# Patient Record
Sex: Male | Born: 1985
Health system: Southern US, Community
[De-identification: ages and names within clinical notes are randomized; demographics above are authoritative.]

---

## 2017-04-06 DIAGNOSIS — M79672 Pain in left foot: Secondary | ICD-10-CM | POA: Diagnosis not present

## 2017-04-14 DIAGNOSIS — M79672 Pain in left foot: Secondary | ICD-10-CM | POA: Diagnosis not present

## 2019-07-24 DIAGNOSIS — Z1331 Encounter for screening for depression: Secondary | ICD-10-CM | POA: Diagnosis not present

## 2019-07-24 DIAGNOSIS — L600 Ingrowing nail: Secondary | ICD-10-CM | POA: Diagnosis not present

## 2019-07-24 DIAGNOSIS — R1030 Lower abdominal pain, unspecified: Secondary | ICD-10-CM | POA: Diagnosis not present

## 2019-07-24 DIAGNOSIS — Z6822 Body mass index (BMI) 22.0-22.9, adult: Secondary | ICD-10-CM | POA: Diagnosis not present

## 2020-03-29 ENCOUNTER — Other Ambulatory Visit: Payer: Self-pay

## 2020-03-29 ENCOUNTER — Encounter (HOSPITAL_COMMUNITY): Payer: Self-pay

## 2020-03-29 ENCOUNTER — Emergency Department (HOSPITAL_COMMUNITY): Payer: Self-pay

## 2020-03-29 ENCOUNTER — Emergency Department (HOSPITAL_COMMUNITY)
Admission: EM | Admit: 2020-03-29 | Discharge: 2020-03-29 | Disposition: A | Discharge status: Home / Self Care | Payer: Self-pay | Attending: Emergency Medicine | Admitting: Emergency Medicine

## 2020-03-29 DIAGNOSIS — R1084 Generalized abdominal pain: Secondary | ICD-10-CM | POA: Diagnosis not present

## 2020-03-29 DIAGNOSIS — R109 Unspecified abdominal pain: Secondary | ICD-10-CM | POA: Diagnosis not present

## 2020-03-29 LAB — COMPREHENSIVE METABOLIC PANEL WITH GFR
ALT: 22 U/L (ref 0–44)
AST: 17 U/L (ref 15–41)
Albumin: 4.9 g/dL (ref 3.5–5.0)
Alkaline Phosphatase: 56 U/L (ref 38–126)
Anion gap: 11 (ref 5–15)
BUN: 15 mg/dL (ref 6–20)
CO2: 27 mmol/L (ref 22–32)
Calcium: 9.9 mg/dL (ref 8.9–10.3)
Chloride: 99 mmol/L (ref 98–111)
Creatinine, Ser: 1.18 mg/dL (ref 0.61–1.24)
GFR calc Af Amer: >60 mL/min
GFR calc non Af Amer: >60 mL/min
Glucose, Bld: 107 mg/dL — ABNORMAL HIGH (ref 70–99)
Potassium: 4 mmol/L (ref 3.5–5.1)
Sodium: 137 mmol/L (ref 135–145)
Total Bilirubin: 1.4 mg/dL — ABNORMAL HIGH (ref 0.3–1.2)
Total Protein: 7.9 g/dL (ref 6.5–8.1)

## 2020-03-29 LAB — URINALYSIS, ROUTINE W REFLEX MICROSCOPIC
Bacteria, UA: NONE SEEN
Bilirubin Urine: NEGATIVE
Glucose, UA: NEGATIVE mg/dL
Hgb urine dipstick: NEGATIVE
Ketones, ur: NEGATIVE mg/dL
Nitrite: NEGATIVE
Protein, ur: NEGATIVE mg/dL
Specific Gravity, Urine: 1.009 (ref 1.005–1.030)
pH: 5 (ref 5.0–8.0)

## 2020-03-29 LAB — CBC
HCT: 45 % (ref 39.0–52.0)
Hemoglobin: 15.8 g/dL (ref 13.0–17.0)
MCH: 31.7 pg (ref 26.0–34.0)
MCHC: 35.1 g/dL (ref 30.0–36.0)
MCV: 90.2 fL (ref 80.0–100.0)
Platelets: 166 10*3/uL (ref 150–400)
RBC: 4.99 MIL/uL (ref 4.22–5.81)
RDW: 12.1 % (ref 11.5–15.5)
WBC: 6.4 10*3/uL (ref 4.0–10.5)
nRBC: 0 % (ref 0.0–0.2)

## 2020-03-29 LAB — LIPASE, BLOOD: Lipase: 24 U/L (ref 11–51)

## 2020-03-29 MED ORDER — SODIUM CHLORIDE 0.9 % IV BOLUS
500.0000 mL | Freq: Once | INTRAVENOUS | Status: AC
  Administered 2020-03-29: 500 mL via INTRAVENOUS

## 2020-03-29 MED ORDER — SODIUM CHLORIDE (PF) 0.9 % IJ SOLN
INTRAMUSCULAR | Status: AC
  Filled 2020-03-29: qty 50

## 2020-03-29 MED ORDER — IOHEXOL 300 MG/ML  SOLN
100.0000 mL | Freq: Once | INTRAMUSCULAR | Status: AC | PRN
  Administered 2020-03-29: 100 mL via INTRAVENOUS

## 2020-03-29 MED ORDER — KETOROLAC TROMETHAMINE 15 MG/ML IJ SOLN
15.0000 mg | Freq: Once | INTRAMUSCULAR | Status: AC
  Administered 2020-03-29: 15 mg via INTRAVENOUS
  Filled 2020-03-29: qty 1

## 2020-03-29 NOTE — ED Provider Notes (Signed)
[facility]-EMERGENCY DEPT Provider Note   CSN: [id number] Arrival date & time: 03/29/20  1107     History Chief Complaint  Patient presents with   Abdominal Pain    Caleb Adams is a 34 y.o. male.  34 year old male with prior medical history detailed below presents for evaluation of abdominal discomfort.  Patient describes intermittent sharp abdominal pain.  Pain is an ongoing issue over the last 4 to 6 months.  He has not yet seen any medical provider about this discomfort.  He reports worsening symptoms over the last week.  He denies associated fever.  He denies associated nausea, vomiting, bowel movement change.  He denies current pain or discomfort.  Pain does not seem to be associated with any specific activity or movement.  The history is provided by the patient and medical records.  Abdominal Pain Pain location:  Periumbilical Pain quality: sharp, shooting and stabbing   Pain radiates to:  Does not radiate Pain severity:  Moderate Onset quality:  Sudden Timing:  Intermittent Progression:  Waxing and waning Chronicity:  New Relieved by:  Nothing Worsened by:  Nothing      History reviewed. No pertinent past medical history.  There are no problems to display for this patient.   History reviewed. No pertinent surgical history.     History reviewed. No pertinent family history.  Social History   Tobacco Use   Smoking status: Never Smoker   Smokeless tobacco: Never Used  Substance Use Topics   Alcohol use: Yes    Comment: occasional   Drug use: Never    Home Medications Prior to Admission medications   Not on File    Allergies    Patient has no known allergies.  Review of Systems   Review of Systems  Gastrointestinal: Positive for abdominal pain.  All other systems reviewed and are negative.   Physical Exam Updated Vital Signs BP (!) 147/106 (BP Location: Right Arm)    Pulse 91    Temp 98.4 F (36.9 C) (Oral)     Resp 17    SpO2 100%   Physical Exam Vitals and nursing note reviewed.  Constitutional:      General: He is not in acute distress.    Appearance: He is well-developed.  HENT:     Head: Normocephalic and atraumatic.  Eyes:     Conjunctiva/sclera: Conjunctivae normal.     Pupils: Pupils are equal, round, and reactive to light.  Cardiovascular:     Rate and Rhythm: Normal rate and regular rhythm.     Heart sounds: Normal heart sounds.  Pulmonary:     Effort: Pulmonary effort is normal. No respiratory distress.     Breath sounds: Normal breath sounds.  Abdominal:     General: Bowel sounds are normal. There is no distension.     Palpations: Abdomen is soft.     Tenderness: There is no abdominal tenderness.  Musculoskeletal:        General: No deformity. Normal range of motion.     Cervical back: Normal range of motion and neck supple.  Skin:    General: Skin is warm and dry.  Neurological:     Mental Status: He is alert and oriented to person, place, and time.     ED Results / Procedures / Treatments   Labs (all labs ordered are listed, but only abnormal results are displayed) Labs Reviewed  COMPREHENSIVE METABOLIC PANEL - Abnormal; Notable for the following components:  Result Value   Glucose, Bld 107 (*)    Total Bilirubin 1.4 (*)    All other components within normal limits  URINALYSIS, ROUTINE W REFLEX MICROSCOPIC - Abnormal; Notable for the following components:   Color, Urine STRAW (*)    Leukocytes,Ua TRACE (*)    All other components within normal limits  LIPASE, BLOOD  CBC    EKG None  Radiology CT Abdomen Pelvis W Contrast  Result Date: 03/29/2020 CLINICAL DATA:  Acute nonlocalized abdominal pain. Patient reports low pelvic pain for 4 months, worse over the past week. EXAM: CT ABDOMEN AND PELVIS WITH CONTRAST TECHNIQUE: Multidetector CT imaging of the abdomen and pelvis was performed using the standard protocol following bolus administration of  intravenous contrast. CONTRAST:  OMNIPAQUE IOHEXOL 300 MG/ML  SOLN COMPARISON:  None. FINDINGS: Lower chest: Lung bases are clear. Hepatobiliary: No focal liver abnormality. Partially distended gallbladder with possible intraluminal stones. No pericholecystic inflammation. No biliary dilatation. Pancreas: No ductal dilatation or inflammation. Spleen: Normal in size without focal abnormality. Adrenals/Urinary Tract: No adrenal nodule. Mild left renal parenchymal atrophy with diffuse thinning and areas of scarring. Dilatation of the left renal pelvis and major calices without ureteral dilatation. 3 mm nonobstructing stone in the lower left kidney. No ureteral calculus. No perinephric edema. Normal appearance of the right kidney without hydronephrosis. Unremarkable urinary bladder. Stomach/Bowel: Nondistended stomach. Small bowel decompressed without obstruction or inflammation. Normal appendix. High-density material within the cecum and proximal ascending colon may represent ingested bismuth containing products. The terminal ileum is normal. Small volume of colonic stool. Mild sigmoid colonic redundancy. No colonic wall thickening. No significant diverticular disease. Vascular/Lymphatic: No significant vascular findings are present. Abdominal aorta is normal in caliber. The portal vein is patent. No enlarged abdominal or pelvic lymph nodes. Reproductive: Prostate is unremarkable. Other: Tiny fat containing umbilical hernia. No free air, free fluid, or intra-abdominal fluid collection. Musculoskeletal: There are no acute or suspicious osseous abnormalities. Incidental bone islands in the left proximal femur. IMPRESSION: 1. No acute abnormality. 2. Possible gallstones. No CT findings of cholecystitis or acute gallbladder inflammation. 3. Mild left renal atrophy with diffuse parenchymal thinning and areas of scarring. Mild left hydronephrosis with transition at the ureteropelvic junction. No perinephric edema.  Overall findings suggest chronic UPJ obstruction, possibly congenital. 4. Nonobstructing 3 mm stone in the lower left kidney. No ureteral or obstructing calculus. Electronically Signed   By: Narda Rutherford M.D.   On: 03/29/2020 18:56    Procedures Procedures (including critical care time)  Medications Ordered in ED Medications  sodium chloride 0.9 % bolus 500 mL (has no administration in time range)    ED Course  I have reviewed the triage vital signs and the nursing notes.  Pertinent labs & imaging results that were available during my care of the patient were reviewed by me and considered in my medical decision making (see chart for details).    MDM Rules/Calculators/A&P                          MDM  Screen complete  Caleb Adams was evaluated in Emergency Department on 03/29/2020 for the symptoms described in the history of present illness. He was evaluated in the context of the global COVID-19 pandemic, which necessitated consideration that the patient might be at risk for infection with the SARS-CoV-2 virus that causes COVID-19. Institutional protocols and algorithms that pertain to the evaluation of patients at risk for COVID-19 are  in a state of rapid change based on information released by regulatory bodies including the CDC and federal and state organizations. These policies and algorithms were followed during the patient's care in the ED.  Patient is presenting for evaluation of chronic abdominal discomfort.  His work-up today did not reveal significant acute medical pathology.  CT imaging could not exclude the possibility of gallstones.  He does not have evidence of biliary obstruction or cholecystitis on work-up.  CT imaging also revealed possible renal stone.  He does not have evidence of obstruction secondary to same.   Patient is advised to closely follow-up with his primary care provider and also with urology.  Given the patient's described symptoms it is possible  that he is having intermittent renal colic.  That being said, he is comfortable at time of discharge.  He does understand the need for close outpatient follow-up.  Strict return precautions given and understood.  Final Clinical Impression(s) / ED Diagnoses Final diagnoses:  Generalized abdominal pain    Rx / DC Orders ED Discharge Orders    None       Wynetta Fines, MD 03/29/20 2002

## 2020-03-29 NOTE — ED Triage Notes (Signed)
Pt c/o bilateral lower quadrant stabbing abd pain, intermittent for several months. Denies any n/v or diarrhea. Denies any other sx

## 2020-03-29 NOTE — ED Notes (Signed)
Assumed care of patient at this time, nad noted, sr up x2, bed locked and low, call bell w/I reach.  Will continue to monitor.  Spouse at the bedside.  

## 2020-03-29 NOTE — Discharge Instructions (Addendum)
Please return for any problem.  You will require further work-up of your reported abdominal discomfort.  CT imaging today did not show any emergent process.  CT imaging today did not exclude the possibility of gallstones or possible kidney stones.  Your regular care providers will need to evaluate you more fully for possible gallstones and kidney stones.

## 2022-01-13 IMAGING — CT CT ABD-PELV W/ CM
2 of 4 series · 16 of 46 positions shown, 18 images · IV contrast (omnipaque)
Comparison: None.

CLINICAL DATA: Acute nonlocalized abdominal pain. Patient reports
low pelvic pain for 4 months, worse over the past week.

EXAM:
CT ABDOMEN AND PELVIS WITH CONTRAST
TECHNIQUE: Multidetector CT imaging of the abdomen and pelvis was performed
using the standard protocol following bolus administration of
intravenous contrast.
CONTRAST:  100mL OMNIPAQUE IOHEXOL 300 MG/ML  SOLN

[Series 2: axial st · axial · 0.82mm/px · z∈[-431,-11]mm · 13 of 96 slices shown, 15 images]
[im 6/96  soft-tissue]
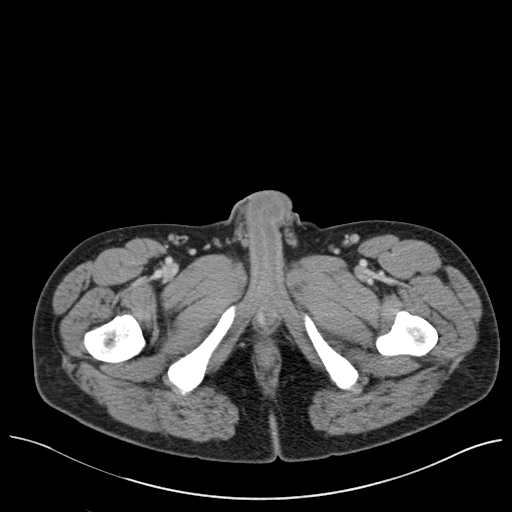
[im 6/96  bone]
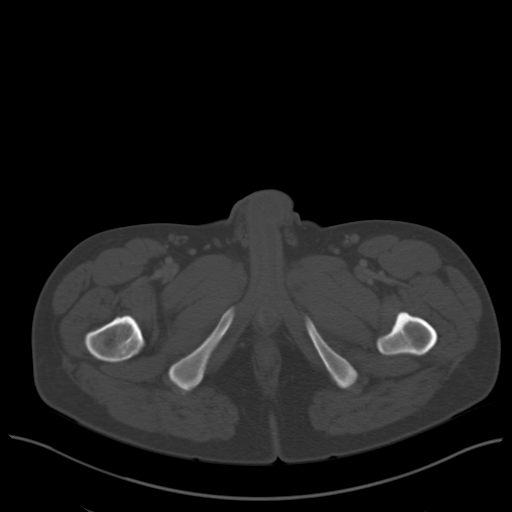
[im 11/96  soft-tissue]
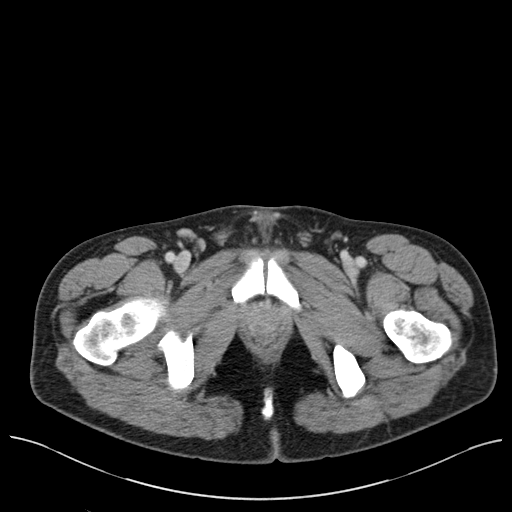
[im 22/96  soft-tissue]
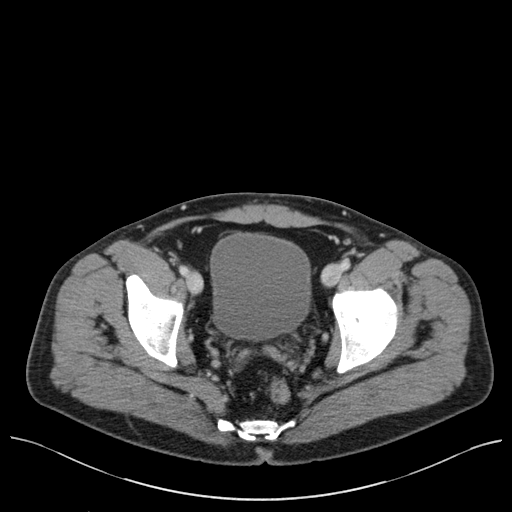
[im 27/96  soft-tissue]
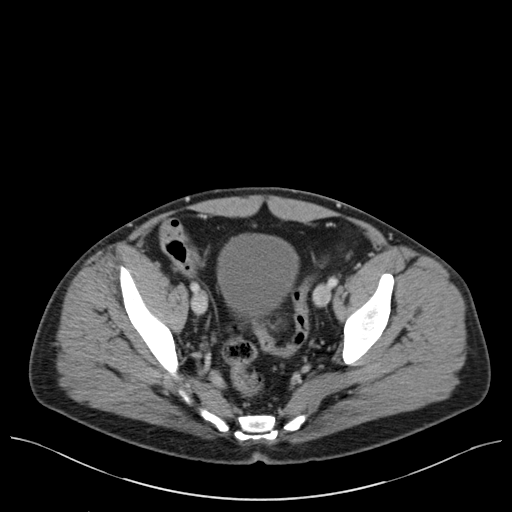
[im 32/96  soft-tissue]
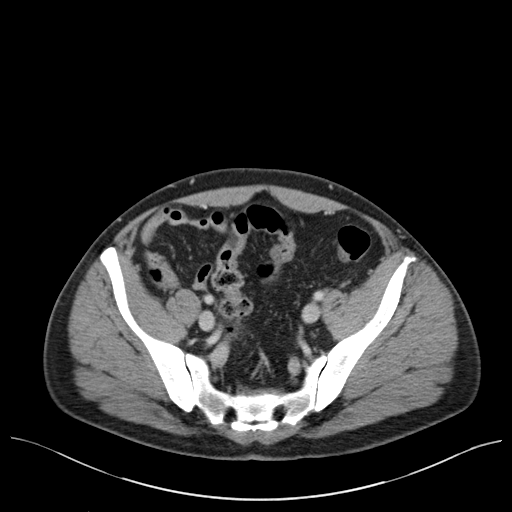
[im 43/96  soft-tissue]
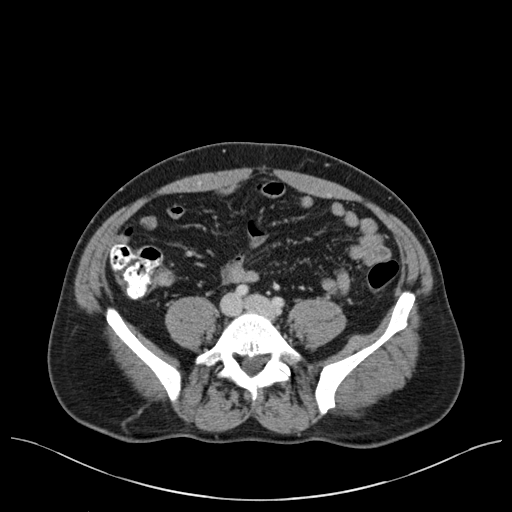
[im 48/96  soft-tissue]
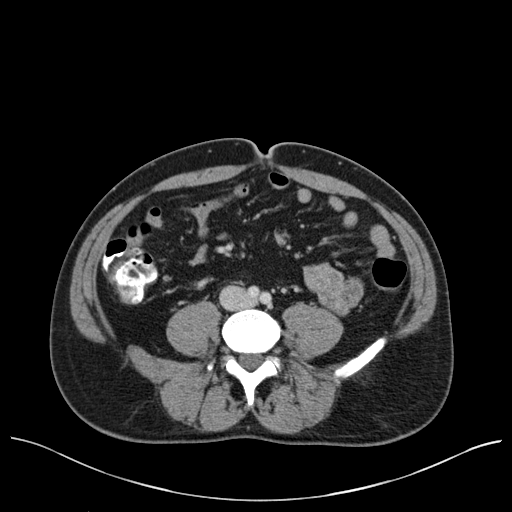
[im 53/96  soft-tissue]
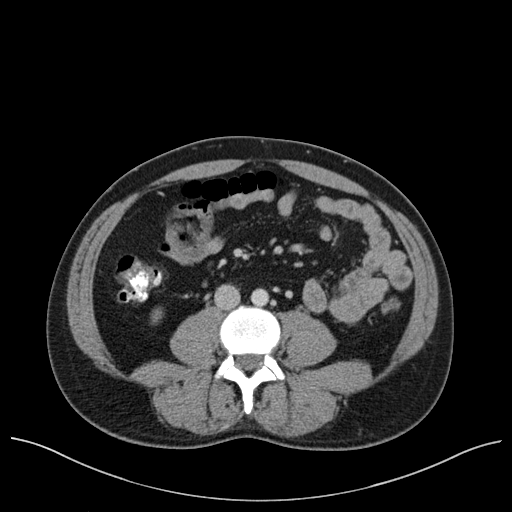
[im 64/96  soft-tissue]
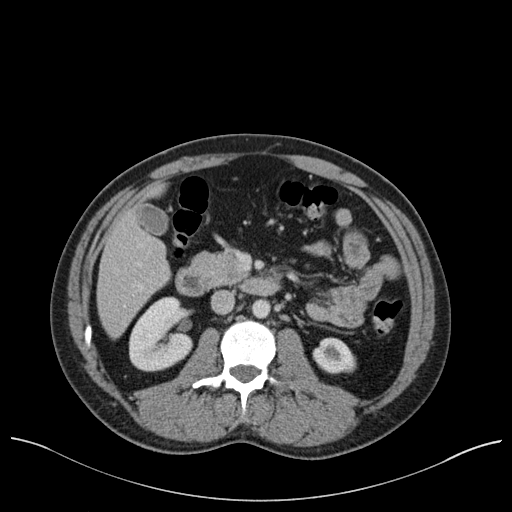
[im 64/96  bone]
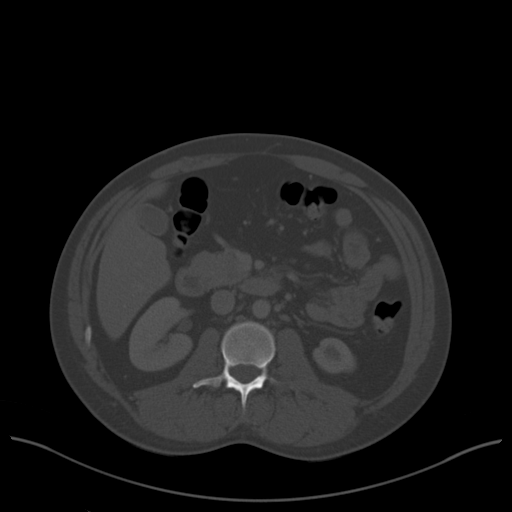
[im 69/96  soft-tissue]
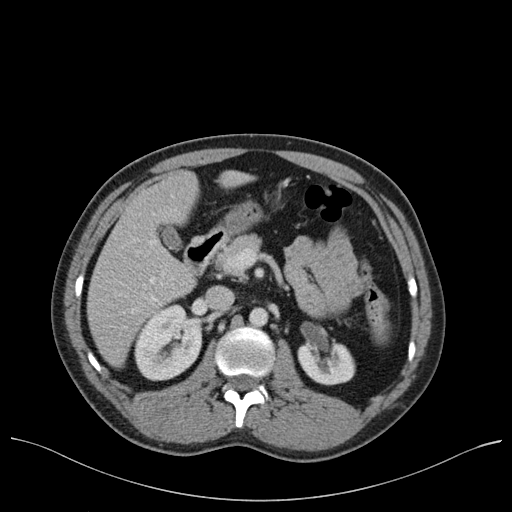
[im 74/96  soft-tissue]
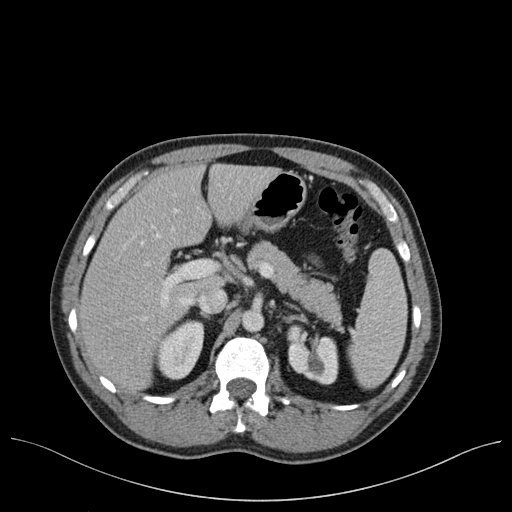
[im 85/96  soft-tissue]
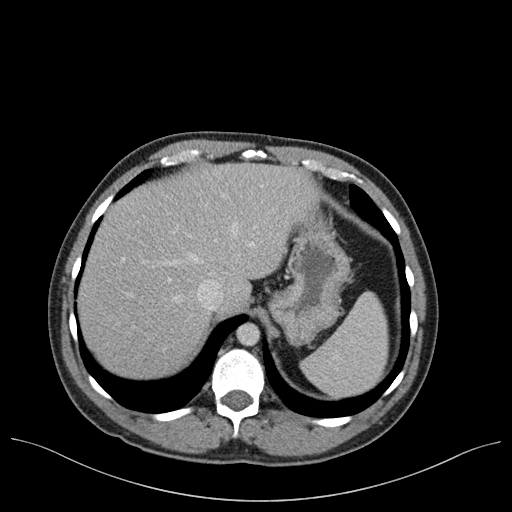
[im 90/96  soft-tissue]
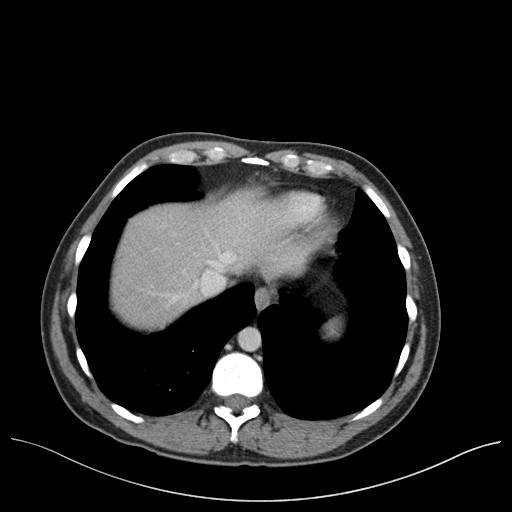

[Series 5: coronal st · coronal · 0.91mm/px · 3 of 151 slices shown]
[im 51/151  soft-tissue]
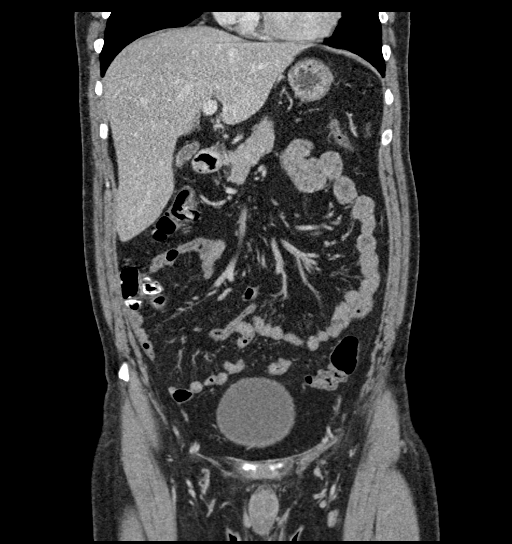
[im 67/151  soft-tissue]
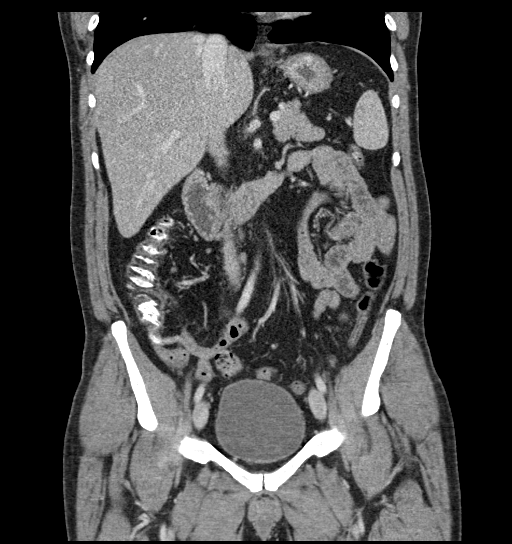
[im 84/151  soft-tissue]
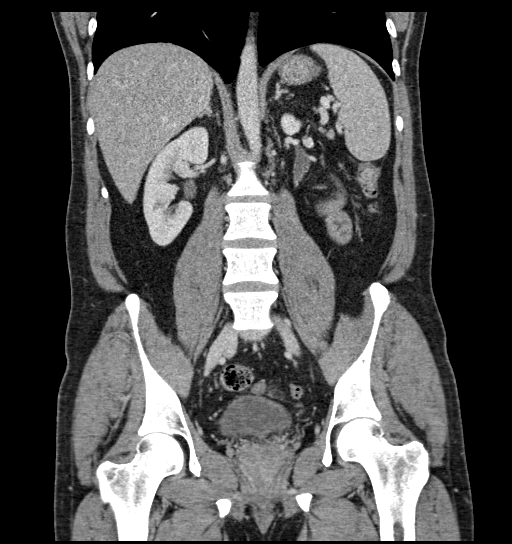

[16 of 46 positions shown; findings below may reference images not displayed]

FINDINGS: Lower chest: Lung bases are clear.

Hepatobiliary: No focal liver abnormality. Partially distended
gallbladder with possible intraluminal stones. No pericholecystic
inflammation. No biliary dilatation.

Pancreas: No ductal dilatation or inflammation.

Spleen: Normal in size without focal abnormality.

Adrenals/Urinary Tract: No adrenal nodule. Mild left renal
parenchymal atrophy with diffuse thinning and areas of scarring.
Dilatation of the left renal pelvis and major calices without
ureteral dilatation. 3 mm nonobstructing stone in the lower left
kidney. No ureteral calculus. No perinephric edema. Normal
appearance of the right kidney without hydronephrosis. Unremarkable
urinary bladder.

Stomach/Bowel: Nondistended stomach. Small bowel decompressed
without obstruction or inflammation. Normal appendix. High-density
material within the cecum and proximal ascending colon may represent
ingested bismuth containing products. The terminal ileum is normal.
Small volume of colonic stool. Mild sigmoid colonic redundancy. No
colonic wall thickening. No significant diverticular disease.

Vascular/Lymphatic: No significant vascular findings are present.
Abdominal aorta is normal in caliber. The portal vein is patent. No
enlarged abdominal or pelvic lymph nodes.

Reproductive: Prostate is unremarkable.

Other: Tiny fat containing umbilical hernia. No free air, free
fluid, or intra-abdominal fluid collection.

Musculoskeletal: There are no acute or suspicious osseous
abnormalities. Incidental bone islands in the left proximal femur.
IMPRESSION: 1. No acute abnormality.
2. Possible gallstones. No CT findings of cholecystitis or acute
gallbladder inflammation.
3. Mild left renal atrophy with diffuse parenchymal thinning and
areas of scarring. Mild left hydronephrosis with transition at the
ureteropelvic junction. No perinephric edema. Overall findings
suggest chronic UPJ obstruction, possibly congenital.
4. Nonobstructing 3 mm stone in the lower left kidney. No ureteral
or obstructing calculus.

## 2023-03-10 ENCOUNTER — Other Ambulatory Visit: Payer: Self-pay | Admitting: Internal Medicine

## 2023-03-10 ENCOUNTER — Ambulatory Visit
Admission: RE | Admit: 2023-03-10 | Discharge: 2023-03-10 | Disposition: A | Discharge status: Home / Self Care | Payer: 59 | Source: Ambulatory Visit | Attending: Internal Medicine | Admitting: Internal Medicine

## 2023-03-10 DIAGNOSIS — R0789 Other chest pain: Secondary | ICD-10-CM
